# Patient Record
Sex: Female | Born: 1975 | Race: Black or African American | Hispanic: No | Marital: Single | Smoking: Former smoker
Health system: Southern US, Community
[De-identification: ages and names within clinical notes are randomized; demographics above are authoritative.]

## PROBLEM LIST (undated history)

## (undated) DIAGNOSIS — J45909 Unspecified asthma, uncomplicated: Secondary | ICD-10-CM

## (undated) HISTORY — PX: NO PAST SURGERIES: SHX2092

## (undated) HISTORY — DX: Unspecified asthma, uncomplicated: J45.909

---

## 2010-03-31 ENCOUNTER — Emergency Department (HOSPITAL_COMMUNITY): Admission: EM | Admit: 2010-03-31 | Discharge: 2010-03-31 | Payer: Self-pay | Admitting: Emergency Medicine

## 2011-04-17 ENCOUNTER — Emergency Department (HOSPITAL_COMMUNITY)
Admission: EM | Admit: 2011-04-17 | Discharge: 2011-04-17 | Disposition: A | Discharge status: Home / Self Care | Payer: BC Managed Care – PPO | Attending: Emergency Medicine | Admitting: Emergency Medicine

## 2011-04-17 DIAGNOSIS — H60399 Other infective otitis externa, unspecified ear: Secondary | ICD-10-CM | POA: Insufficient documentation

## 2011-04-17 DIAGNOSIS — H9209 Otalgia, unspecified ear: Secondary | ICD-10-CM | POA: Insufficient documentation

## 2011-04-17 DIAGNOSIS — H9210 Otorrhea, unspecified ear: Secondary | ICD-10-CM | POA: Insufficient documentation

## 2011-04-17 DIAGNOSIS — H6092 Unspecified otitis externa, left ear: Secondary | ICD-10-CM

## 2011-04-17 MED ORDER — CIPROFLOXACIN-DEXAMETHASONE 0.3-0.1 % OT SUSP: 4.0000 [drp] | Freq: Two times a day (BID) | OTIC | Status: AC

## 2011-04-17 NOTE — ED Provider Notes (Signed)
History     CSN: [id number] Arrival date & time: 04/17/2011  7:13 PM   First MD Initiated Contact with Patient 04/17/11 2003      Chief Complaint  Patient presents with  . Otalgia    (Consider location/radiation/quality/duration/timing/severity/associated sxs/prior treatment) Otalgia  There is pain in the left ear. This is a new problem. The current episode started in the past 7 days. The problem has been gradually worsening. There has been no fever. The pain is mild. Associated symptoms include drainage and ear discharge. Pertinent negatives include no hearing loss or neck pain. She has tried nothing for the symptoms.    History reviewed. No pertinent past medical history.  History reviewed. No pertinent past surgical history.  History reviewed. No pertinent family history.  History  Substance Use Topics  . Smoking status: Never Smoker   . Smokeless tobacco: Not on file  . Alcohol Use: Yes    OB History    Grav Para Term Preterm Abortions TAB SAB Ect Mult Living                  Review of Systems  HENT: Positive for ear pain and ear discharge. Negative for hearing loss and neck pain.   All other systems reviewed and are negative.    Allergies  Review of patient's allergies indicates no known allergies.  Home Medications  No current outpatient prescriptions on file.  BP 141/74  Pulse 97  Temp(Src) 98.3 F (36.8 C) (Oral)  Resp 18  Ht 5\' 1"  (1.549 m)  Wt 140 lb (63.504 kg)  BMI 26.45 kg/m2  SpO2 100%  LMP 04/08/2011  Physical Exam  Constitutional: She is oriented to person, place, and time. She appears well-developed and well-nourished.  HENT:  Head: Normocephalic.  Left Ear: Hearing normal. There is drainage.  Ears:  Eyes: Pupils are equal, round, and reactive to light.  Neck: Normal range of motion. Neck supple.  Cardiovascular: Normal rate, regular rhythm and normal heart sounds.   Pulmonary/Chest: Effort normal and breath sounds normal.    Abdominal: Soft. Bowel sounds are normal.  Musculoskeletal: Normal range of motion.  Neurological: She is alert and oriented to person, place, and time.  Skin: Skin is warm and dry.  Psychiatric: She has a normal mood and affect.    ED Course  Procedures (including critical care time)  Labs Reviewed - No data to display No results found.   No diagnosis found.    MDM  Otitis externa        Jimmye Norman, NP 04/17/11 2018

## 2011-04-17 NOTE — ED Notes (Signed)
Pt presents with 2 day h/o L ear pain.  Pt reports noting brown, dried drainage in ear canal.

## 2011-04-17 NOTE — ED Notes (Signed)
States she noted slight pain to touch and yawning. Numbness and spaced out feeling with movement of head. States she feels as if she is congested

## 2011-04-18 NOTE — ED Provider Notes (Signed)
Medical screening examination/treatment/procedure(s) were performed by non-physician practitioner and as supervising physician I was immediately available for consultation/collaboration.    Nelia Shi, MD 04/18/11 [phone]

## 2012-07-30 LAB — HM PAP SMEAR: HM Pap smear: NORMAL

## 2012-08-04 ENCOUNTER — Other Ambulatory Visit: Payer: Self-pay | Admitting: Obstetrics & Gynecology

## 2012-08-12 ENCOUNTER — Other Ambulatory Visit: Payer: Self-pay

## 2012-08-12 ENCOUNTER — Telehealth: Payer: Self-pay | Admitting: *Deleted

## 2012-08-12 NOTE — Telephone Encounter (Signed)
Patient called for lab results. Patient advised normal labs, pap, and urine culture.

## 2012-08-21 ENCOUNTER — Ambulatory Visit (HOSPITAL_COMMUNITY)
Admission: RE | Admit: 2012-08-21 | Discharge: 2012-08-21 | Disposition: A | Discharge status: Home / Self Care | Payer: BC Managed Care – PPO | Source: Ambulatory Visit | Attending: Obstetrics & Gynecology | Admitting: Obstetrics & Gynecology

## 2012-08-21 DIAGNOSIS — D259 Leiomyoma of uterus, unspecified: Secondary | ICD-10-CM

## 2012-08-21 DIAGNOSIS — N83209 Unspecified ovarian cyst, unspecified side: Secondary | ICD-10-CM | POA: Insufficient documentation

## 2012-09-04 ENCOUNTER — Encounter: Payer: Self-pay | Admitting: Obstetrics & Gynecology

## 2012-09-04 ENCOUNTER — Ambulatory Visit (INDEPENDENT_AMBULATORY_CARE_PROVIDER_SITE_OTHER): Payer: BC Managed Care – PPO | Admitting: Obstetrics & Gynecology

## 2012-09-04 VITALS — BP 129/74 | HR 72 | Temp 97.4°F | Ht 61.0 in | Wt 176.0 lb

## 2012-09-04 DIAGNOSIS — N83209 Unspecified ovarian cyst, unspecified side: Secondary | ICD-10-CM | POA: Insufficient documentation

## 2012-09-04 NOTE — Progress Notes (Signed)
Subjective:     Shirley Haney is a 37 y.o. female here to review U/S results.  Current complaints: none.  Personal health questionnaire reviewed: no.      The following portions of the patient's history were reviewed and updated as appropriate: allergies, current medications, past family history, past medical history, past social history, past surgical history and problem list.  Review of Systems Pertinent items are noted in HPI.    Objective:     No exam     Assessment:   Adnexal mass, asymptomatic  Plan:   Repeat U/S; f/u after the U/S

## 2012-09-04 NOTE — Patient Instructions (Signed)
Ovarian Cyst The ovaries are small organs that are on each side of the uterus. The ovaries are the organs that produce the female hormones, estrogen and progesterone. An ovarian cyst is a sac filled with fluid that can vary in its size. It is normal for a small cyst to form in women who are in the childbearing age and who have menstrual periods. This type of cyst is called a follicle cyst that becomes an ovulation cyst (corpus luteum cyst) after it produces the women's egg. It later goes away on its own if the woman does not become pregnant. There are other kinds of ovarian cysts that may cause problems and may need to be treated. The most serious problem is a cyst with cancer. It should be noted that menopausal women who have an ovarian cyst are at a higher risk of it being a cancer cyst. They should be evaluated very quickly, thoroughly and followed closely. This is especially true in menopausal women because of the high rate of ovarian cancer in women in menopause. CAUSES AND TYPES OF OVARIAN CYSTS:  FUNCTIONAL CYST: The follicle/corpus luteum cyst is a functional cyst that occurs every month during ovulation with the menstrual cycle. They go away with the next menstrual cycle if the woman does not get pregnant. Usually, there are no symptoms with a functional cyst.  ENDOMETRIOMA CYST: This cyst develops from the lining of the uterus tissue. This cyst gets in or on the ovary. It grows every month from the bleeding during the menstrual period. It is also called a "chocolate cyst" because it becomes filled with blood that turns brown. This cyst can cause pain in the lower abdomen during intercourse and with your menstrual period.  CYSTADENOMA CYST: This cyst develops from the cells on the outside of the ovary. They usually are not cancerous. They can get very big and cause lower abdomen pain and pain with intercourse. This type of cyst can twist on itself, cut off its blood supply and cause severe pain. It  also can easily rupture and cause a lot of pain.  DERMOID CYST: This type of cyst is sometimes found in both ovaries. They are found to have different kinds of body tissue in the cyst. The tissue includes skin, teeth, hair, and/or cartilage. They usually do not have symptoms unless they get very big. Dermoid cysts are rarely cancerous.  POLYCYSTIC OVARY: This is a rare condition with hormone problems that produces many small cysts on both ovaries. The cysts are follicle-like cysts that never produce an egg and become a corpus luteum. It can cause an increase in body weight, infertility, acne, increase in body and facial hair and lack of menstrual periods or rare menstrual periods. Many women with this problem develop type 2 diabetes. The exact cause of this problem is unknown. A polycystic ovary is rarely cancerous.  THECA LUTEIN CYST: Occurs when too much hormone (human chorionic gonadotropin) is produced and over-stimulates the ovaries to produce an egg. They are frequently seen when doctors stimulate the ovaries for invitro-fertilization (test tube babies).  LUTEOMA CYST: This cyst is seen during pregnancy. Rarely it can cause an obstruction to the birth canal during labor and delivery. They usually go away after delivery. SYMPTOMS   Pelvic pain or pressure.  Pain during sexual intercourse.  Increasing girth (swelling) of the abdomen.  Abnormal menstrual periods.  Increasing pain with menstrual periods.  You stop having menstrual periods and you are not pregnant. DIAGNOSIS  The diagnosis can   be made during:  Routine or annual pelvic examination (common).  Ultrasound.  X-ray of the pelvis.  CT Scan.  MRI.  Blood tests. TREATMENT   Treatment may only be to follow the cyst monthly for 2 to 3 months with your caregiver. Many go away on their own, especially functional cysts.  May be aspirated (drained) with a long needle with ultrasound, or by laparoscopy (inserting a tube into  the pelvis through a small incision).  The whole cyst can be removed by laparoscopy.  Sometimes the cyst may need to be removed through an incision in the lower abdomen.  Hormone treatment is sometimes used to help dissolve certain cysts.  Birth control pills are sometimes used to help dissolve certain cysts. HOME CARE INSTRUCTIONS  Follow your caregiver's advice regarding:  Medicine.  Follow up visits to evaluate and treat the cyst.  You may need to come back or make an appointment with another caregiver, to find the exact cause of your cyst, if your caregiver is not a gynecologist.  Get your yearly and recommended pelvic examinations and Pap tests.  Let your caregiver know if you have had an ovarian cyst in the past. SEEK MEDICAL CARE IF:   Your periods are late, irregular, they stop, or are painful.  Your stomach (abdomen) or pelvic pain does not go away.  Your stomach becomes larger or swollen.  You have pressure on your bladder or trouble emptying your bladder completely.  You have painful sexual intercourse.  You have feelings of fullness, pressure, or discomfort in your stomach.  You lose weight for no apparent reason.  You feel generally ill.  You become constipated.  You lose your appetite.  You develop acne.  You have an increase in body and facial hair.  You are gaining weight, without changing your exercise and eating habits.  You think you are pregnant. SEEK IMMEDIATE MEDICAL CARE IF:   You have increasing abdominal pain.  You feel sick to your stomach (nausea) and/or vomit.  You develop a fever that comes on suddenly.  You develop abdominal pain during a bowel movement.  Your menstrual periods become heavier than usual. Document Released: 05/14/2005 Document Revised: 08/06/2011 Document Reviewed: 03/17/2009 [employer] Patient Information 2013 [employer]  

## 2012-10-02 ENCOUNTER — Ambulatory Visit (HOSPITAL_COMMUNITY)
Admission: RE | Admit: 2012-10-02 | Discharge: 2012-10-02 | Disposition: A | Discharge status: Home / Self Care | Payer: BC Managed Care – PPO | Source: Ambulatory Visit | Attending: Obstetrics & Gynecology | Admitting: Obstetrics & Gynecology

## 2012-10-02 DIAGNOSIS — Z09 Encounter for follow-up examination after completed treatment for conditions other than malignant neoplasm: Secondary | ICD-10-CM | POA: Insufficient documentation

## 2012-10-02 DIAGNOSIS — N83209 Unspecified ovarian cyst, unspecified side: Secondary | ICD-10-CM | POA: Insufficient documentation

## 2013-05-11 ENCOUNTER — Ambulatory Visit: Payer: BC Managed Care – PPO | Admitting: Obstetrics & Gynecology

## 2013-05-14 ENCOUNTER — Encounter: Payer: Self-pay | Admitting: Obstetrics & Gynecology

## 2013-05-14 ENCOUNTER — Ambulatory Visit (INDEPENDENT_AMBULATORY_CARE_PROVIDER_SITE_OTHER): Payer: BC Managed Care – PPO | Admitting: Obstetrics & Gynecology

## 2013-05-14 VITALS — BP 124/85 | HR 88 | Temp 98.5°F | Ht 61.0 in | Wt 174.0 lb

## 2013-05-14 DIAGNOSIS — N760 Acute vaginitis: Secondary | ICD-10-CM

## 2013-05-14 DIAGNOSIS — N926 Irregular menstruation, unspecified: Secondary | ICD-10-CM

## 2013-05-14 DIAGNOSIS — Z7251 High risk heterosexual behavior: Secondary | ICD-10-CM

## 2013-05-14 LAB — POCT URINALYSIS DIPSTICK
Blood, UA: NEGATIVE
Glucose, UA: NEGATIVE
Nitrite, UA: NEGATIVE
Spec Grav, UA: 1.015
Urobilinogen, UA: NEGATIVE
pH, UA: 6

## 2013-05-14 LAB — POCT URINE PREGNANCY: Preg Test, Ur: NEGATIVE

## 2013-05-14 MED ORDER — VAGINAL DISPENSER MISC: Status: DC

## 2013-05-14 MED ORDER — TINIDAZOLE 500 MG PO TABS: 500.0000 mg | ORAL_TABLET | Freq: Two times a day (BID) | ORAL | Status: AC

## 2013-05-14 MED ORDER — METRONIDAZOLE 0.75 % VA GEL: VAGINAL | Status: AC

## 2013-05-14 MED ORDER — DOXYCYCLINE HYCLATE 100 MG PO TABS: 100.0000 mg | ORAL_TABLET | Freq: Two times a day (BID) | ORAL | Status: AC

## 2013-05-14 NOTE — Patient Instructions (Signed)
4 prescriptions have been sent to the pharmacy Doxycyline, Tindamax  You can take together, along with using the boric acid capsules. After you complete the borica acid ovules start the metrogel vaginal cream twice a week for 3 months.

## 2013-05-14 NOTE — Progress Notes (Signed)
Subjective:     Shirley Haney is a 37 y.o. female here for a problem exam.  Current complaints: bleeding during intercourse, denies current bleeding discharge, reports chronic BV would like to discuss using boric acid.  Personal health questionnaire reviewed: yes.   Gynecologic History Patient's last menstrual period was 04/16/2013. Contraception: none Last Pap: 07/30/2012. Results were: normal Last mammogram: N/A  Obstetric History OB History  No data available     The following portions of the patient's history were reviewed and updated as appropriate: allergies, current medications, past family history, past medical history, past social history, past surgical history and problem list.  Review of Systems Pertinent items are noted in HPI.    Objective:     Pelvic: SPEC--small ectropion, friable     Assessment:   Postcoital bleeding--cervical ectropion Recurrent bacterial vaginosis  Plan:    Boric acid/suppression Return in several months

## 2013-05-15 LAB — GC/CHLAMYDIA PROBE AMP: GC Probe RNA: NEGATIVE

## 2013-05-20 LAB — POCT WET PREP (WET MOUNT)

## 2013-05-25 ENCOUNTER — Other Ambulatory Visit: Payer: Self-pay | Admitting: *Deleted

## 2013-05-25 MED ORDER — VAGINAL DISPENSER MISC: Status: AC

## 2013-05-27 ENCOUNTER — Encounter: Payer: Self-pay | Admitting: Obstetrics & Gynecology

## 2013-05-29 ENCOUNTER — Other Ambulatory Visit: Payer: Self-pay | Admitting: *Deleted

## 2013-05-29 ENCOUNTER — Telehealth: Payer: Self-pay | Admitting: *Deleted

## 2013-05-29 NOTE — Telephone Encounter (Signed)
Call placed to pt making her aware that Rx had been called in to [facility] in [facility].  Pt advised to call if any problems.

## 2013-08-13 ENCOUNTER — Ambulatory Visit: Payer: BC Managed Care – PPO | Admitting: Obstetrics & Gynecology

## 2013-08-20 ENCOUNTER — Ambulatory Visit: Payer: BC Managed Care – PPO | Admitting: Obstetrics & Gynecology

## 2014-05-24 ENCOUNTER — Encounter: Payer: Self-pay | Admitting: *Deleted

## 2014-05-25 ENCOUNTER — Encounter: Payer: Self-pay | Admitting: Obstetrics & Gynecology

## 2015-01-01 IMAGING — US US TRANSVAGINAL NON-OB
1 series · 13 of 25 positions shown · non-contrast
Comparison: None

CLINICAL DATA: Evaluate ovarian cyst.  LMP 08/06/2012



[Series 1: us pelvis complete · 13 of 109 slices shown]
[im 1/109]
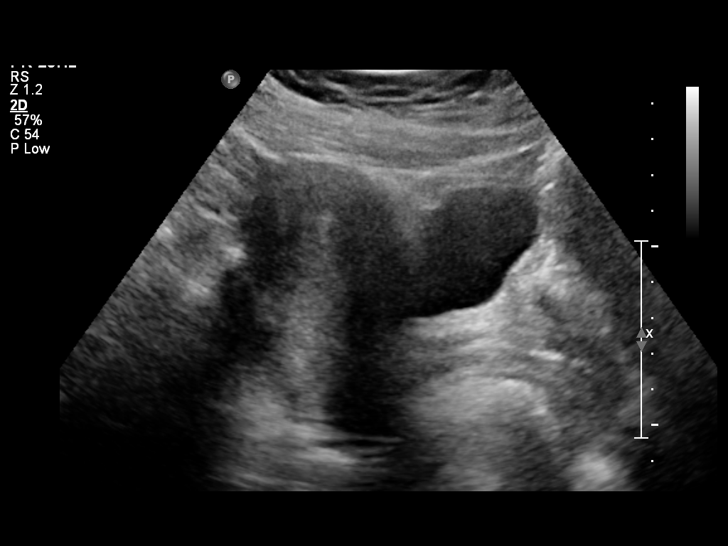
[im 10/109]
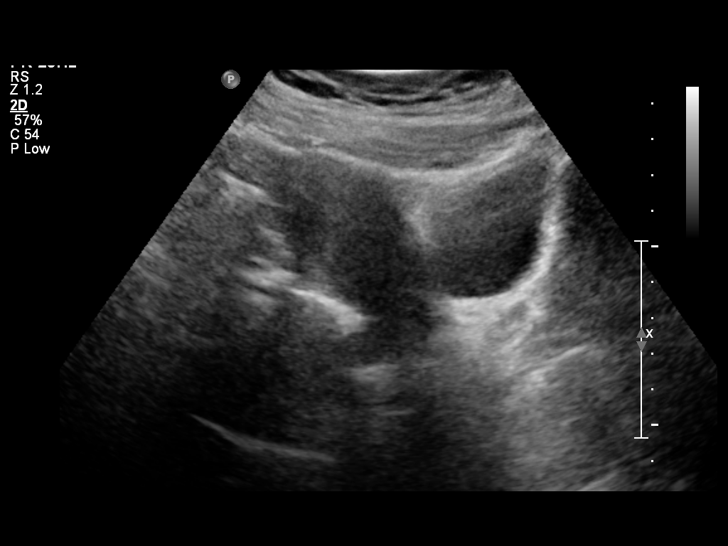
[im 19/109]
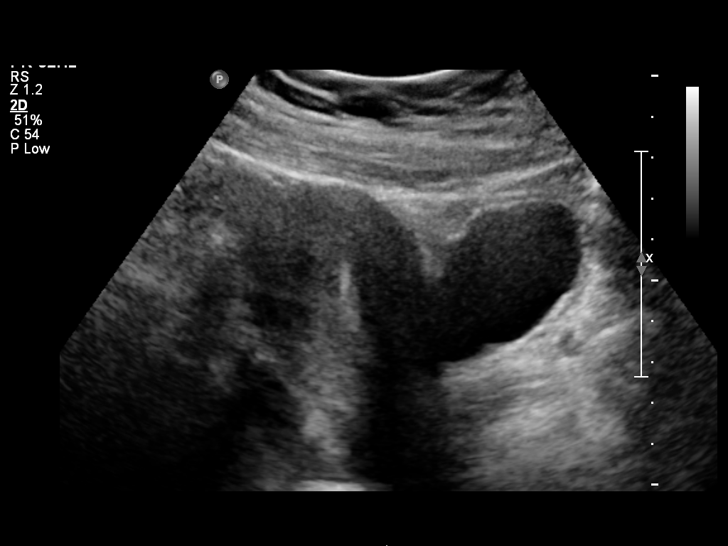
[im 28/109]
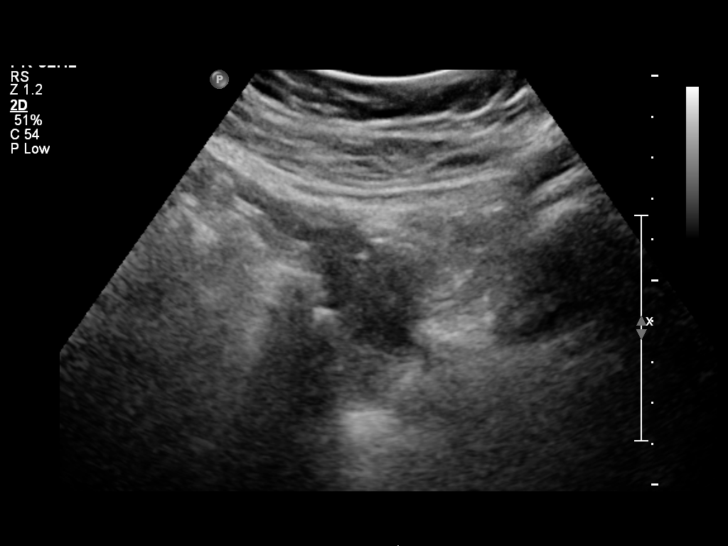
[im 37/109]
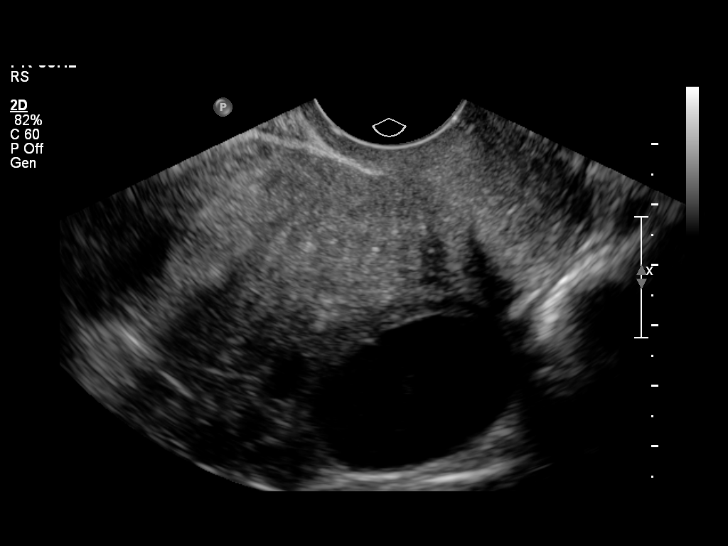
[im 46/109]
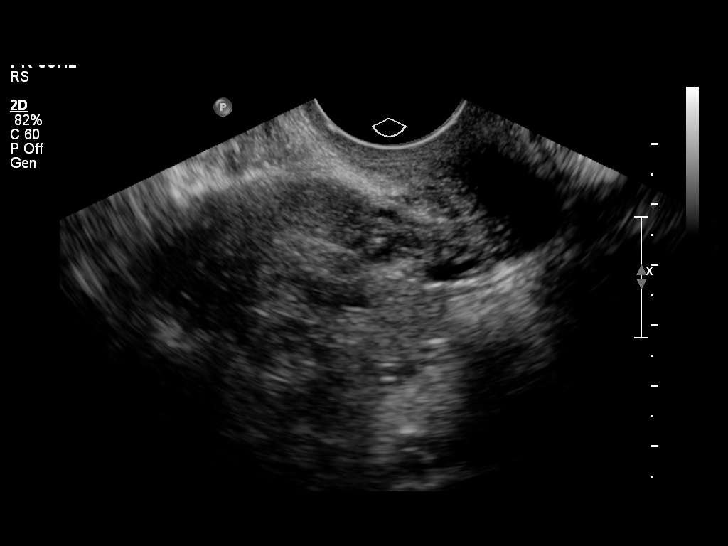
[im 55/109]
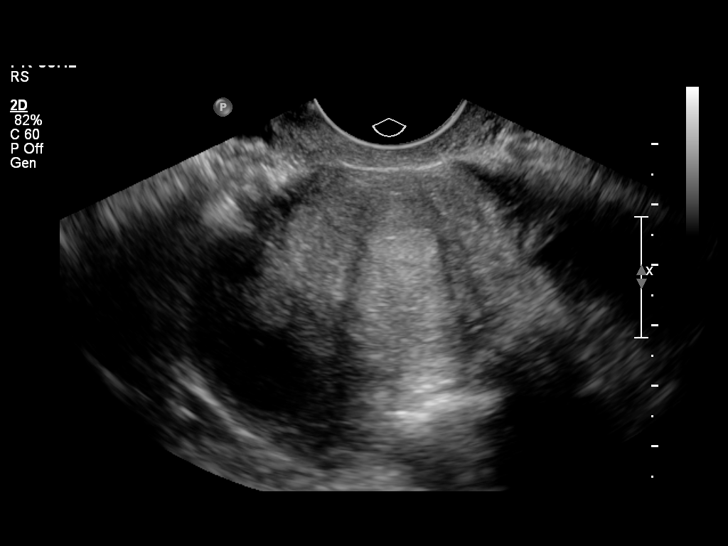
[im 64/109]
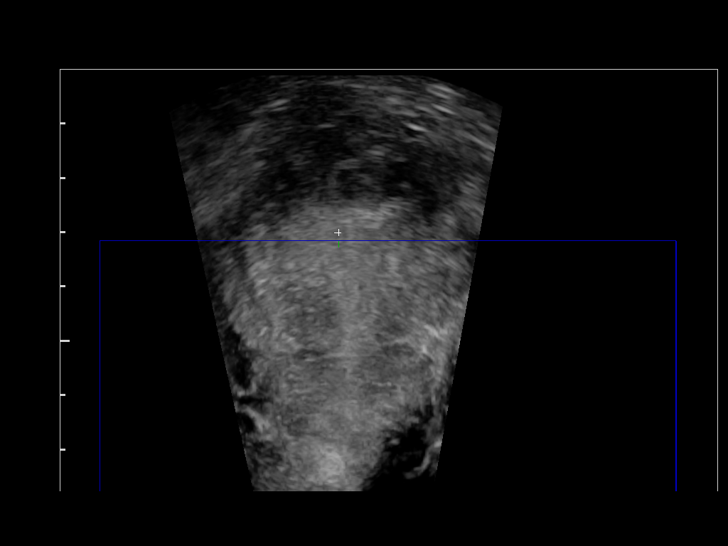
[im 73/109]
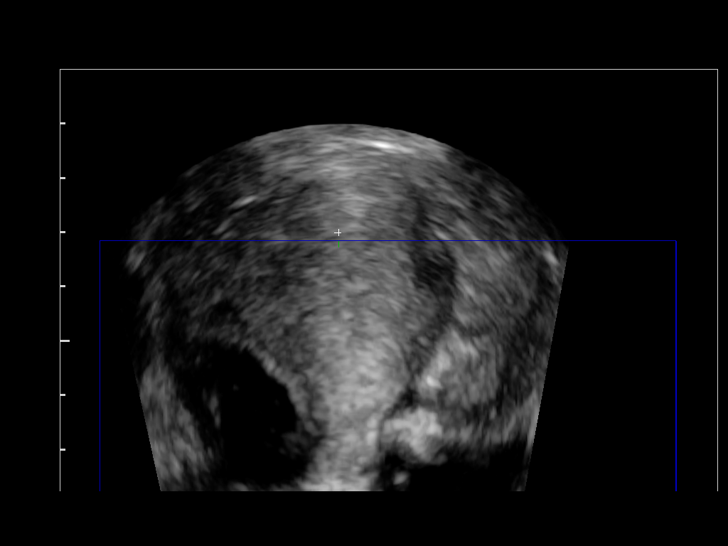
[im 82/109]
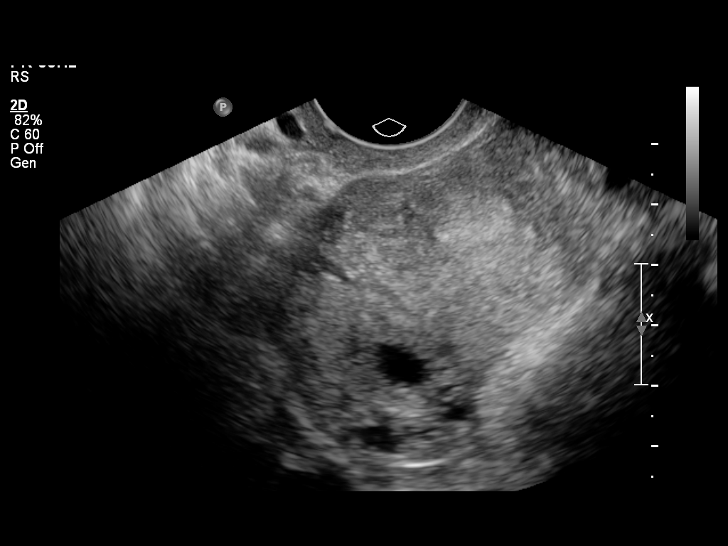
[im 91/109]
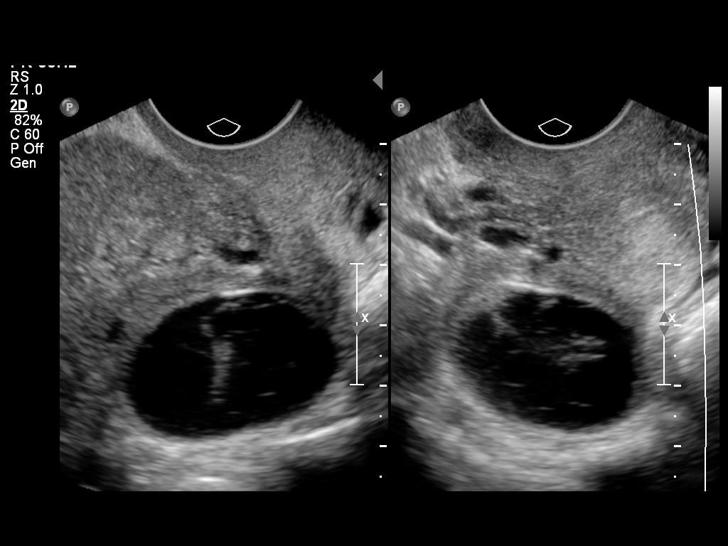
[im 100/109]
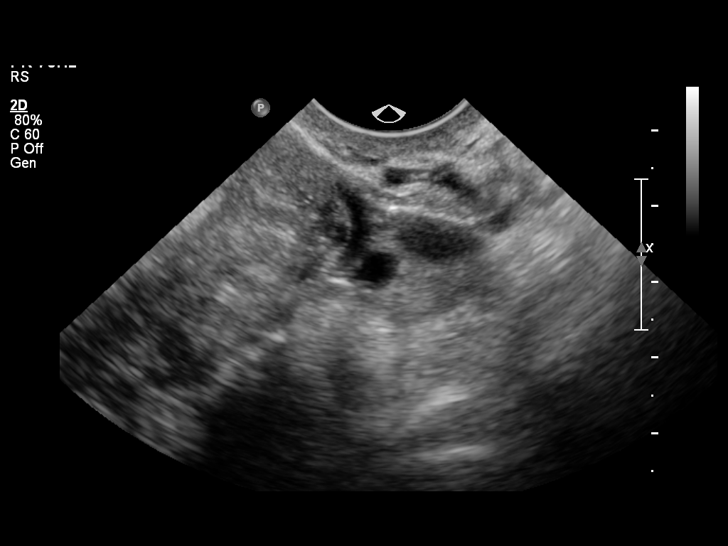
[im 109/109]
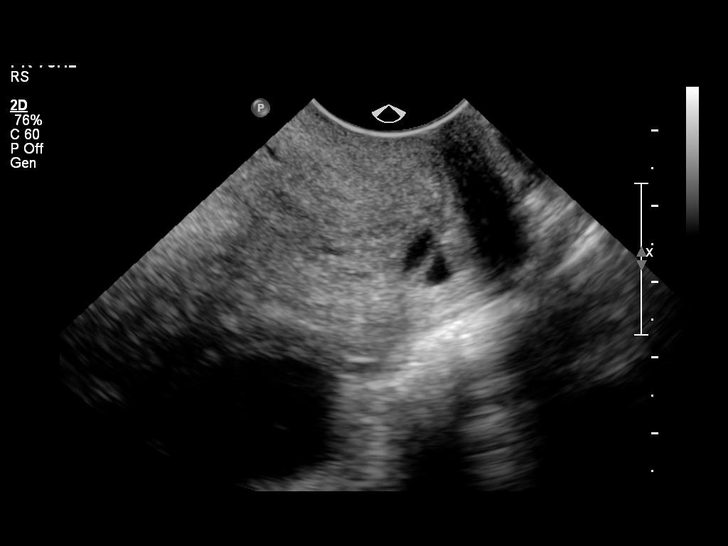

[13 of 25 positions shown; findings below may reference images not displayed]

FINDINGS: Uterus: Is anteverted and anteflexed and demonstrates a sagittal
length of 8.4 cm, depth of 4.2 cm and width of 5.4 cm.  A
homogeneous myometrium is seen

Endometrium: Has a width of 6 mm and no areas of focal thickening
or heterogeneity.

Right ovary:  Measures 4.1 x 2.5 x 3.5 cm and contains a unilocular
complex cystic lesion measuring 3.5 x 2.2 x 2.9 cm.  This contains
several thin internal septations and one thicker appearing
septation with a width of 3.8 mm.  This is avascular in nature and
may be the result of retracting clot and fibrinous change in a
hemorrhagic cyst.  A neoplastic process cannot be completely
excluded with this appearance.

Left ovary: Has a normal appearance measuring 2.6 x 1.7 x 2.1 cm

Other findings: No pelvic fluid or separate adnexal masses are
seen.
IMPRESSION: Complex unilocular right ovarian cyst with indeterminate imaging
characteristics.  Internal septations are seen but are avascular
and may represent residua from a hemorrhagic cyst.  As a neoplastic
process cannot be excluded, short-term follow-up is recommended in
6-8 weeks for initial reassessment. A hemorrhagic cyst should show
interval resolution/evolution while a neoplastic process would
remain unchanged.

Normal uterine myometrium, endometrium and left ovary..

## 2015-02-12 IMAGING — US US TRANSVAGINAL NON-OB
1 series · 14 of 24 positions shown · non-contrast
Comparison: 08/21/2012

CLINICAL DATA: Follow-up right ovarian cyst

TRANSVAGINAL ULTRASOUND OF PELVIS
TECHNIQUE: Transvaginal ultrasound examination of the pelvis was
performed including evaluation of the uterus, ovaries, adnexal
regions, and pelvic cul-de-sac.

[Series 1: us transvaginal non-ob · 14 of 24 slices shown]
[im 1/24]
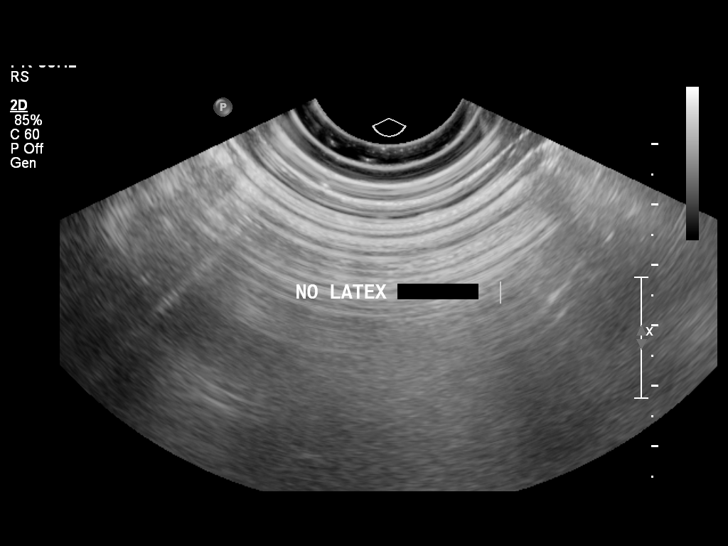
[im 3/24]
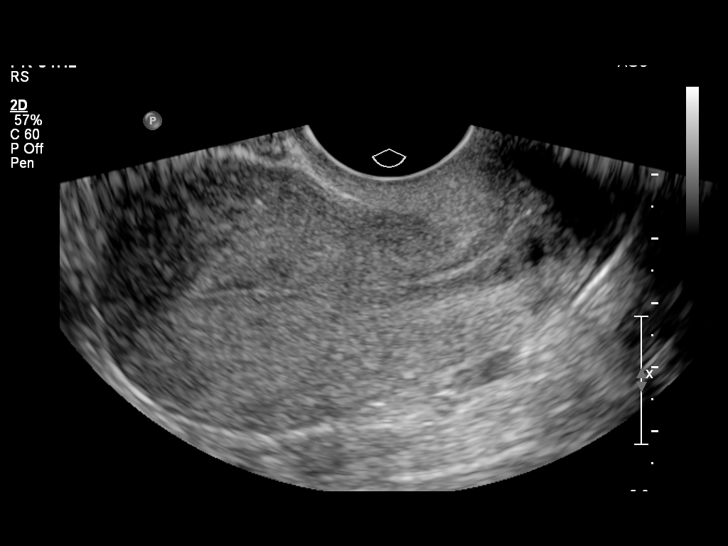
[im 5/24]
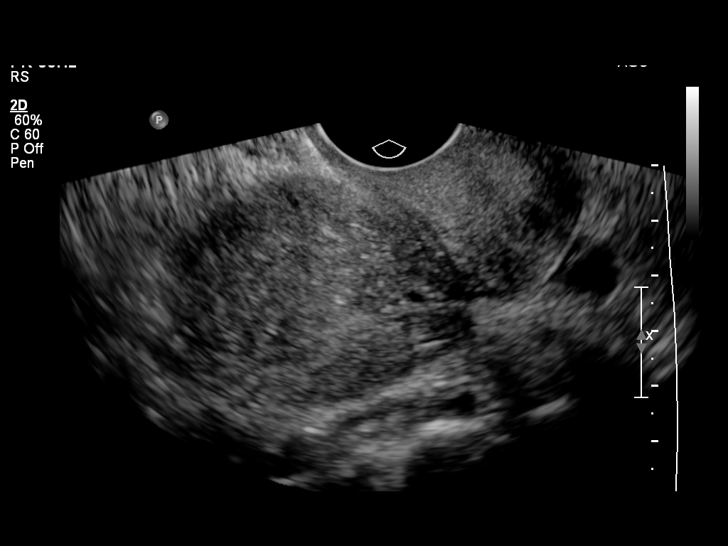
[im 7/24]
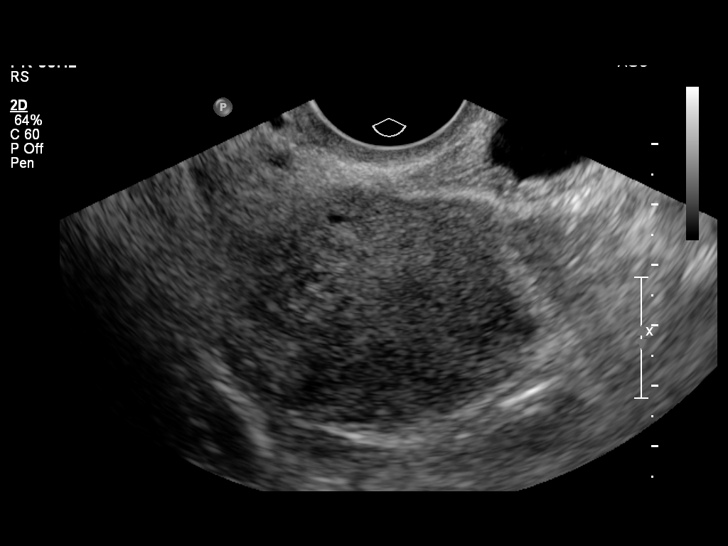
[im 8/24]
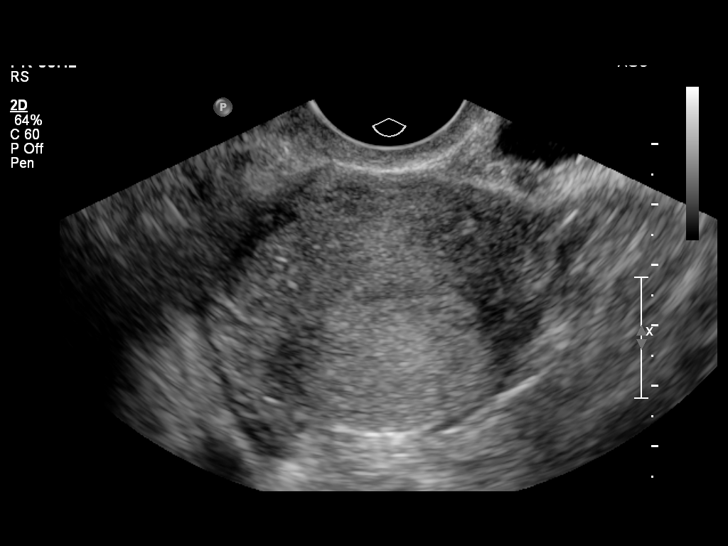
[im 10/24]
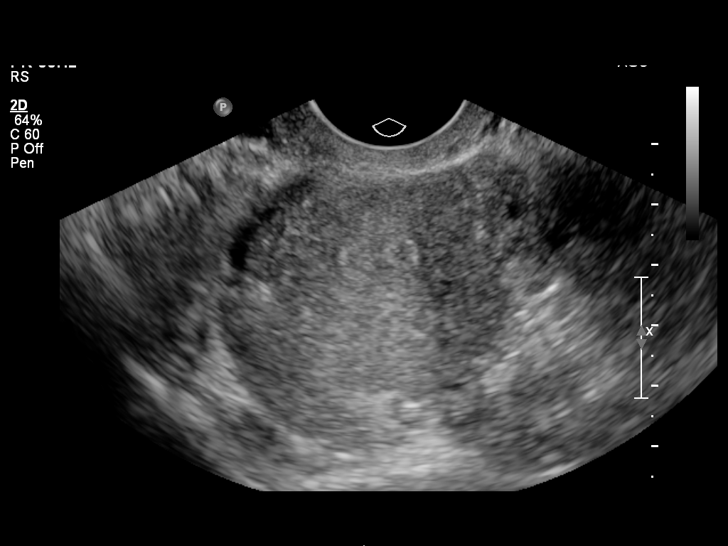
[im 12/24]
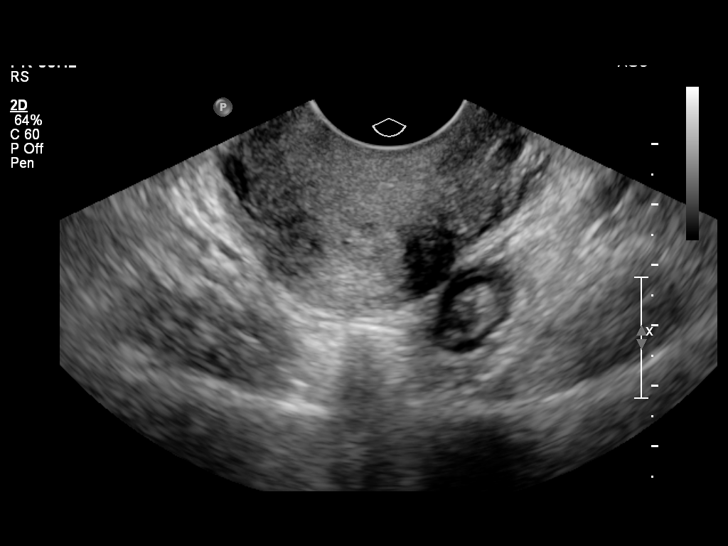
[im 13/24]
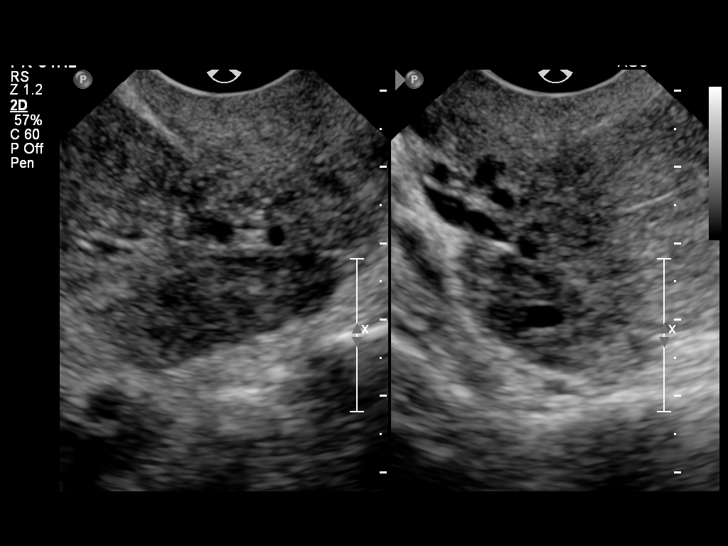
[im 15/24]
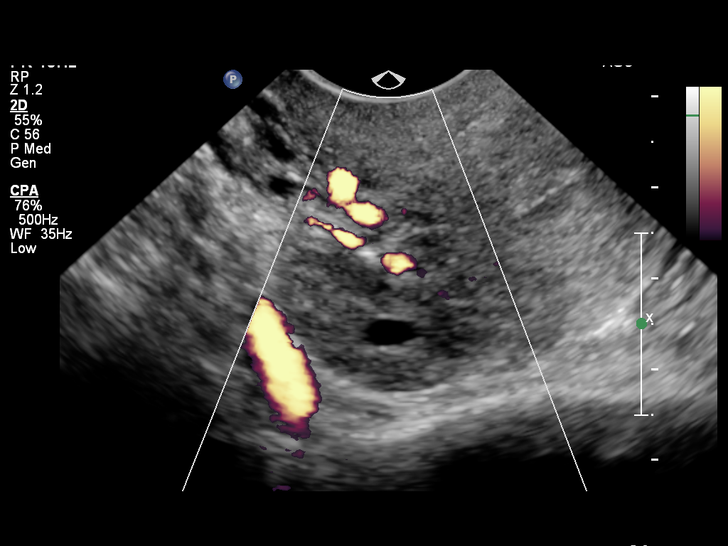
[im 17/24]
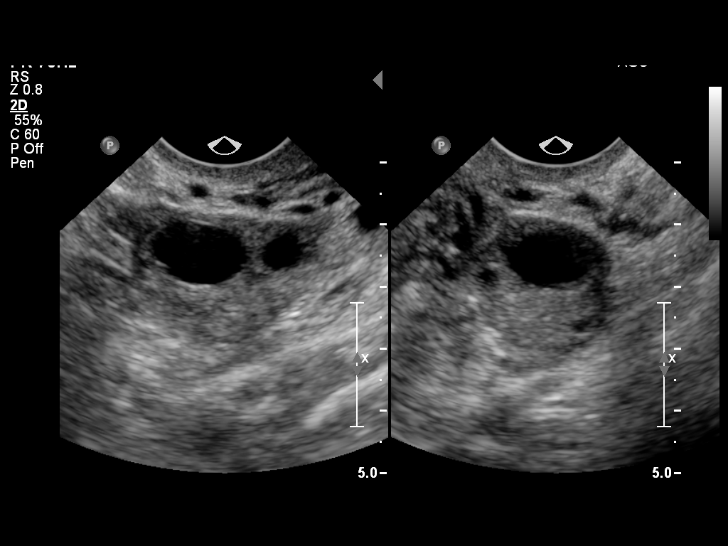
[im 19/24]
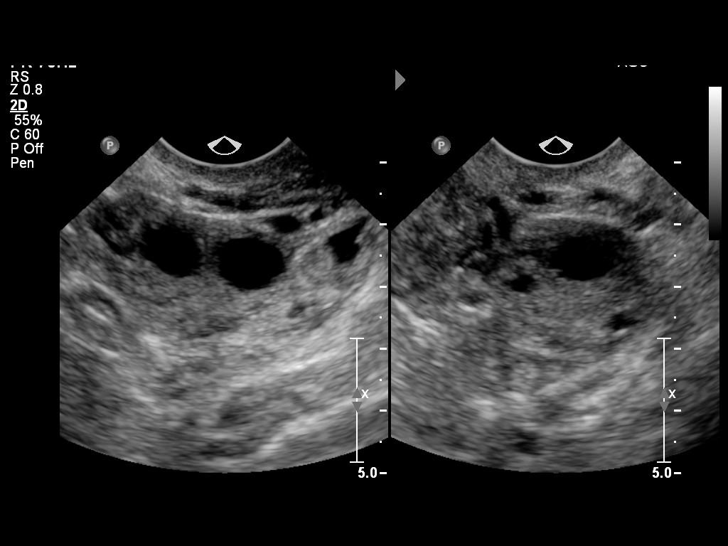
[im 20/24]
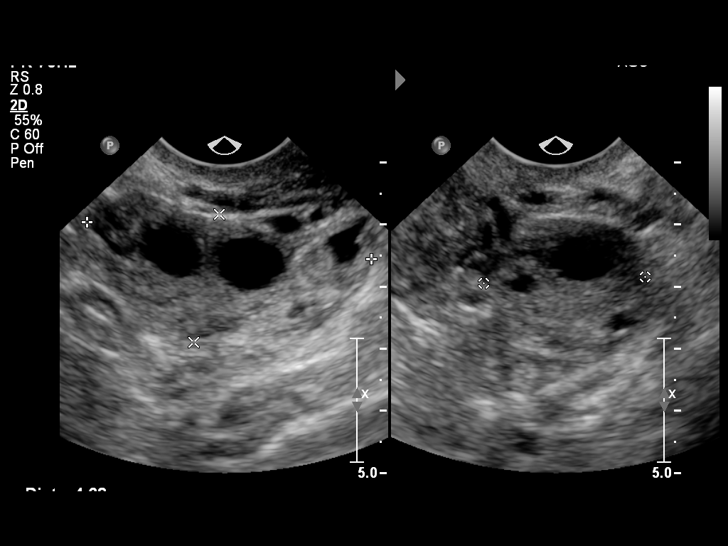
[im 22/24]
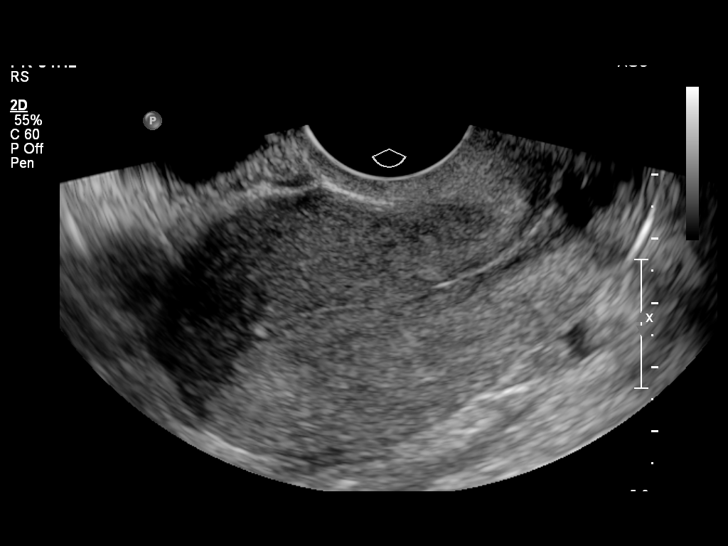
[im 24/24]
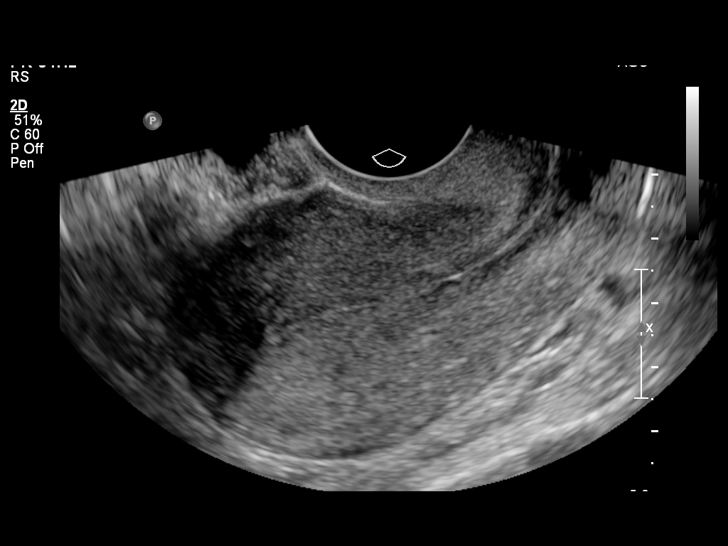

[14 of 24 positions shown; findings below may reference images not displayed]

FINDINGS: Uterus:  Normal in size and appearance.  7.8 x 5.6 x 4.3 cm.

Endometrium: 2 mm.  Uniformly echogenic and thin without focal
abnormality.

Right ovary: 3.2 x 1.5 x 1.3 cm.  Normal.

Left ovary: 4.6 x 2.6 x 2.1 cm.  Normal.

Other Findings:  No free fluid
IMPRESSION: Normal exam.  Interval resolution of right previously seen
hemorrhagic cyst.
